# Patient Record
Sex: Male | Born: 1985 | Race: White | Hispanic: Yes | Marital: Single | Smoking: Never smoker
Health system: Southern US, Community
[De-identification: ages and names within clinical notes are randomized; demographics above are authoritative.]

---

## 2006-06-25 ENCOUNTER — Encounter (INDEPENDENT_AMBULATORY_CARE_PROVIDER_SITE_OTHER): Payer: Self-pay | Admitting: *Deleted

## 2006-06-25 ENCOUNTER — Emergency Department (HOSPITAL_COMMUNITY): Admission: EM | Admit: 2006-06-25 | Discharge: 2006-06-25 | Payer: Self-pay | Admitting: Emergency Medicine

## 2008-03-12 ENCOUNTER — Emergency Department (HOSPITAL_COMMUNITY): Admission: EM | Admit: 2008-03-12 | Discharge: 2008-03-12 | Payer: Self-pay | Admitting: Emergency Medicine

## 2008-03-28 ENCOUNTER — Emergency Department (HOSPITAL_COMMUNITY): Admission: EM | Admit: 2008-03-28 | Discharge: 2008-03-28 | Payer: Self-pay | Admitting: Emergency Medicine

## 2011-03-31 NOTE — Consult Note (Signed)
NAME:  FERMAN, BASILIO      ACCOUNT NO.:  [id number]   MEDICAL RECORD NO.:  [id number]          PATIENT TYPE:  EMS   LOCATION:  [facility]                         FACILITY:  [facility]   PHYSICIAN:  Artist Pais. Weingold, M.D.DATE OF BIRTH:  1986-08-20   DATE OF CONSULTATION:  DATE OF DISCHARGE:                                   CONSULTATION   REASON FOR CONSULTATION:  Mr. Cina is a 25 year old right-hand  dominant male, presents today with traumatic paint gun injury to his  nondominant left long finger with a tip amputation with contamination of the  distal part.  He is 25 years old, right hand dominant.  No known drug  allergies.  No current medications.  No recent hospitalizations or  surgeries.   FAMILY HISTORY:  Not contributory.   SOCIAL HISTORY:  Noncontributory.   PHYSICAL EXAMINATION:  GENERAL:  Fairly well-nourished male, pleasant, alert  and oriented x3.  Examination of his left hand.  He has a transverse amputation at the level  of the germinal matrix.  He has a slight bit of exposed distal phalangeal  bone, the proximal part.  The finger and hand show no evidence of the pain  going up into the flexor sheath.  No pain over the flexor sheath is noted.  The Kanavel sign is negative.  No __________ adenopathy.  The distal tip,  however, is completely infiltrated with oil-based paint.   IMPRESSION:  A 25 year old male with a tip amputation using a paint gun of  the nondominant left long finger.  Patient was given a 2% plain lidocaine  digital sheath block, was prepped and draped in usual sterile fashion.  Once  this was done bilateral Kutler V-Y flaps are raised on the radial and ulnar  sides and drawn over the top to cover the distal phalangeal bone.  He was  discharged with Keflex and Percocet for pain.  Call my office in 24-48  hours, sooner if there are any signs of infection.      Artist Pais Mina Marble, M.D.  Electronically Signed     MAW/MEDQ  D:   06/25/2006  T:  06/26/2006  Job:  [id number]

## 2015-04-10 ENCOUNTER — Emergency Department (HOSPITAL_BASED_OUTPATIENT_CLINIC_OR_DEPARTMENT_OTHER)
Admission: EM | Admit: 2015-04-10 | Discharge: 2015-04-10 | Disposition: A | Discharge status: Home / Self Care | Payer: 59 | Attending: Emergency Medicine | Admitting: Emergency Medicine

## 2015-04-10 ENCOUNTER — Encounter (HOSPITAL_BASED_OUTPATIENT_CLINIC_OR_DEPARTMENT_OTHER): Payer: Self-pay

## 2015-04-10 DIAGNOSIS — M545 Low back pain: Secondary | ICD-10-CM | POA: Diagnosis present

## 2015-04-10 DIAGNOSIS — M5430 Sciatica, unspecified side: Secondary | ICD-10-CM | POA: Diagnosis not present

## 2015-04-10 MED ORDER — HYDROMORPHONE HCL 1 MG/ML IJ SOLN
2.0000 mg | Freq: Once | INTRAMUSCULAR | Status: AC
  Administered 2015-04-10: 2 mg via INTRAMUSCULAR
  Filled 2015-04-10: qty 2

## 2015-04-10 MED ORDER — ONDANSETRON 8 MG PO TBDP
8.0000 mg | ORAL_TABLET | Freq: Once | ORAL | Status: AC
  Administered 2015-04-10: 8 mg via ORAL
  Filled 2015-04-10: qty 1

## 2015-04-10 MED ORDER — DIAZEPAM 5 MG PO TABS
5.0000 mg | ORAL_TABLET | Freq: Once | ORAL | Status: AC
  Administered 2015-04-10: 5 mg via ORAL
  Filled 2015-04-10: qty 1

## 2015-04-10 MED ORDER — DIAZEPAM 5 MG PO TABS: 5.0000 mg | ORAL_TABLET | Freq: Four times a day (QID) | ORAL | Status: AC | PRN

## 2015-04-10 MED ORDER — DEXAMETHASONE SODIUM PHOSPHATE 10 MG/ML IJ SOLN
10.0000 mg | Freq: Once | INTRAMUSCULAR | Status: AC
  Administered 2015-04-10: 10 mg via INTRAMUSCULAR
  Filled 2015-04-10: qty 1

## 2015-04-10 MED ORDER — DIAZEPAM 5 MG/ML IJ SOLN
5.0000 mg | Freq: Once | INTRAMUSCULAR | Status: DC
  Filled 2015-04-10: qty 2

## 2015-04-10 MED ORDER — IBUPROFEN 800 MG PO TABS: 800.0000 mg | ORAL_TABLET | Freq: Three times a day (TID) | ORAL | Status: AC

## 2015-04-10 MED ORDER — OXYCODONE-ACETAMINOPHEN 5-325 MG PO TABS: 1.0000 | ORAL_TABLET | ORAL | Status: DC | PRN

## 2015-04-10 NOTE — ED Provider Notes (Signed)
CSN: [id number]     Arrival date & time 04/10/15  1809 History  This chart was scribed for Gilda Creasehristopher J Jermaine Barot, MD by Evon Slackerrance Branch, ED Scribe. This patient was seen in room MH09/MH09 and the patient's care was started at 6:20 PM.    Chief Complaint  Patient presents with  . Back Pain   Patient is a 29 y.o. male presenting with back pain. The history is provided by the patient. No language interpreter was used.  Back Pain Associated symptoms: numbness    HPI Comments: Jermaine Duke is a 29 y.o. male who presents to the Emergency Department complaining of right sided low back pain onset 1 day prior. Pt states that the pain is radiating down his right leg down to his toes. Pt reports some numbness in the right leg as well.  Pt denies injury or fall. Pt reports Hx of sciatica.   History reviewed. No pertinent past medical history. History reviewed. No pertinent past surgical history. No family history on file. History  Substance Use Topics  . Smoking status: Never Smoker   . Smokeless tobacco: Not on file  . Alcohol Use: Not on file    Review of Systems  Musculoskeletal: Positive for back pain.  Neurological: Positive for numbness.  All other systems reviewed and are negative.    Allergies  Review of patient's allergies indicates no known allergies.  Home Medications   Prior to Admission medications   Medication Sig Start Date End Date Taking? Authorizing Provider  diazepam (VALIUM) 5 MG tablet Take 1 tablet (5 mg total) by mouth every 6 (six) hours as needed for anxiety (spasms). 04/10/15   Gilda Creasehristopher J Tinslee Klare, MD  ibuprofen (ADVIL,MOTRIN) 800 MG tablet Take 1 tablet (800 mg total) by mouth 3 (three) times daily. 04/10/15   Gilda Creasehristopher J Jack Bolio, MD  oxyCODONE-acetaminophen (PERCOCET) 5-325 MG per tablet Take 1-2 tablets by mouth every 4 (four) hours as needed. 04/10/15   Gilda Creasehristopher J Minaal Struckman, MD   BP 126/74 mmHg  Pulse 56  Temp(Src) 97.7 F (36.5 C) (Oral)   Resp 16  SpO2 99%   Physical Exam  Constitutional: He is oriented to person, place, and time. He appears well-developed and well-nourished. No distress.  HENT:  Head: Normocephalic and atraumatic.  Right Ear: Hearing normal.  Left Ear: Hearing normal.  Nose: Nose normal.  Mouth/Throat: Oropharynx is clear and moist and mucous membranes are normal.  Eyes: Conjunctivae and EOM are normal. Pupils are equal, round, and reactive to light.  Neck: Normal range of motion. Neck supple.  Cardiovascular: Regular rhythm, S1 normal and S2 normal.  Exam reveals no gallop and no friction rub.   No murmur heard. Pulmonary/Chest: Effort normal and breath sounds normal. No respiratory distress. He exhibits no tenderness.  Abdominal: Soft. Normal appearance and bowel sounds are normal. There is no hepatosplenomegaly. There is no tenderness. There is no rebound, no guarding, no tenderness at McBurney's point and negative Murphy's sign. No hernia.  Musculoskeletal: Normal range of motion. He exhibits tenderness.  Pain tenderness and  Spasm of right lower back, pain with ROM of right leg, normal strength and sensation.   Neurological: He is alert and oriented to person, place, and time. He has normal strength. No cranial nerve deficit or sensory deficit. Coordination normal. GCS eye subscore is 4. GCS verbal subscore is 5. GCS motor subscore is 6.  Skin: Skin is warm, dry and intact. No rash noted. No cyanosis.  Psychiatric: He has a normal mood and  affect. His speech is normal and behavior is normal. Thought content normal.  Nursing note and vitals reviewed.   ED Course  Procedures (including critical care time) DIAGNOSTIC STUDIES: Oxygen Saturation is 98% on RA, normal by my interpretation.    COORDINATION OF CARE: 6:27 PM-Discussed treatment plan with pt at bedside and pt agreed to plan.     Labs Review Labs Reviewed - No data to display  Imaging Review No results found.   EKG  Interpretation None      MDM   Final diagnoses:  Sciatica, unspecified laterality   She presents to the ER for evaluation of back pain radiating down legs, predominantly on the right side. Patient reports that he has been having numbness and tingling in the right lower leg. Patient denies injury. He does have a history of sciatica in the past. Patient has normal strength and sensation in lower extremity's. No saddle anesthesia. There is no nerve deficit.  And Zofran. After the child a lot of he started to feel nauseated and ill. He did not want any further medications, but ultimately he was convinced to have a Decadron shot and take oral Valium because he is having spasms when he tries to sit up. When lying flat he appears comfortable. Patient was counseled that he will need to have continued treatment for sciatica at home, will need follow-up if not improving for MRI. Examination today does not show any signs that would require urgent or emergent MRI.   I personally performed the services described in this documentation, which was scribed in my presence. The recorded information has been reviewed and is accurate.       Gilda Crease, MD 04/10/15 [phone]

## 2015-04-10 NOTE — ED Notes (Signed)
Patient here with back pain radiating down right leg, states that his toes feel slightly numb, denies injury

## 2015-04-10 NOTE — Discharge Instructions (Signed)
Citica  (Sciatica)  La citica es el dolor, debilidad, entumecimiento u hormigueo a lo largo del nervio citico. El nervio comienza en la zona inferior de la espalda y desciende por la parte posterior de cada pierna. El nervio controla los msculos de la parte inferior de la pierna y de la zona posterior de la rodilla, y transmite la sensibilidad a la parte posterior del muslo, la pierna y la planta del pie. La citica es un sntoma de otras afecciones mdicas. Por ejemplo, un dao a los nervios o algunas enfermedades como un disco herniado o un espoln seo en la columna vertebral, podran daarle o presionar en el nervio citico. Esto causa dolor, debilidad y otras sensaciones normalmente asociadas con la citica. Generalmente la citica afecta slo un lado del cuerpo. CAUSAS   Disco herniado o desplazado.  Enfermedad degenerativa del disco.  Un sndrome doloroso que compromete un msculo angosto de los glteos (sndrome piriforme).  Lesin o fractura plvica.  Embarazo.  Tumor (casos raros). SNTOMAS  Los sntomas pueden variar de leves a muy graves. Por lo general, los sntomas descienden desde la zona lumbar a las nalgas y la parte posterior de la pierna. Ellos son:   Hormigueo leve o dolor sordo en la parte inferior de la espalda, la pierna o la cadera.  Adormecimiento en la parte posterior de la pantorrilla o la planta del pie.  Sensacin de quemazn en la zona lumbar, la pierna o la cadera.  Dolor agudo en la zona inferior de la espalda, la pierna o la cadera.  Debilidad en las piernas.  Dolor de espalda intenso que inhibe los movimientos. Los sntomas pueden empeorar al toser, estornudar, rer o estar sentado o parado durante mucho tiempo. Adems, el sobrepeso puede empeorar los sntomas.  DIAGNSTICO  Su mdico le har un examen fsico para buscar los sntomas comunes de la citica. Le pedir que haga algunos movimientos o actividades que activaran el dolor del nervio  citico. Para encontrar las causas de la citica podr indicarle otros estudios. Estos pueden ser:   Anlisis de sangre.  Radiografas.  Pruebas de diagnstico por imgenes, como resonancia magntica o tomografa computada. TRATAMIENTO  El tratamiento se dirige a las causas de la citica. A veces, el tratamiento no es necesario, y el dolor y el malestar desaparecen por s mismos. Si necesita tratamiento, su mdico puede sugerir:   Medicamentos de venta libre para aliviar el dolor.  Medicamentos recetados, como antiinflamatorios, relajantes musculares o narcticos.  Aplicacin de calor o hielo en la zona del dolor.  Inyecciones de corticoides para disminuir el dolor, la irritacin y la inflamacin alrededor del nervio.  Reduccin de la actividad en los perodos de dolor.  Ejercicios y estiramiento del abdomen para fortalecer y mejorar la flexibilidad de la columna vertebral. Su mdico puede sugerirle perder peso si el peso extra empeora el dolor de espalda.  Fisioterapia.  La ciruga para eliminar lo que presiona o pincha el nervio, como un espoln seo o parte de una hernia de disco. INSTRUCCIONES PARA EL CUIDADO EN EL HOGAR   Slo tome medicamentos de venta libre o recetados para calmar el dolor o el malestar, segn las indicaciones de su mdico.  Aplique hielo sobre el rea dolorida durante 20 minutos 3-4 veces por da durante los primeras 48-72 horas. Luego intente aplicar calor de la misma manera.  Haga ejercicios, elongue o realice sus actividades habituales, si no le causan ms dolor.  Cumpla con todas las sesiones de fisioterapia, segn le   indique su mdico.  Cumpla con todas las visitas de control, segn le indique su mdico.  No use tacones altos o zapatos que no tengan buen apoyo.  Verifique que el colchn no sea muy blando. Un colchn firme aliviar el dolor y las molestias. SOLICITE ATENCIN MDICA DE INMEDIATO SI:   Pierde el control de la vejiga o del intestino  (incontinencia).  Aumenta la debilidad en la zona inferior de la espalda, la pelvis, las nalgas o las piernas.  Siente irritacin o inflamacin en la espalda.  Tiene sensacin de ardor al orinar.  El dolor empeora cuando se acuesta o lo despierta por la noche.  El dolor es peor del que experiment en el pasado.  Dura ms de 4 semanas.  Pierde peso sin motivo de manera sbita. ASEGRESE DE QUE:   Comprende estas instrucciones.  Controlar su enfermedad.  Solicitar ayuda de inmediato si no mejora o si empeora. Document Released: 10/30/2005 Document Revised: 04/30/2012 Exit[employer] Patient Information 2015 [employer]. This information is not intended to replace advice given to you by your health care provider. Make sure you discuss any questions you have with your health care provider.  

## 2019-06-17 ENCOUNTER — Other Ambulatory Visit: Payer: Self-pay

## 2019-06-17 ENCOUNTER — Emergency Department (HOSPITAL_COMMUNITY)
Admission: EM | Admit: 2019-06-17 | Discharge: 2019-06-18 | Disposition: A | Discharge status: Home / Self Care | Payer: Self-pay | Attending: Emergency Medicine | Admitting: Emergency Medicine

## 2019-06-17 ENCOUNTER — Emergency Department (HOSPITAL_COMMUNITY): Payer: Self-pay

## 2019-06-17 ENCOUNTER — Encounter (HOSPITAL_COMMUNITY): Payer: Self-pay

## 2019-06-17 DIAGNOSIS — R600 Localized edema: Secondary | ICD-10-CM | POA: Insufficient documentation

## 2019-06-17 DIAGNOSIS — M79641 Pain in right hand: Secondary | ICD-10-CM | POA: Insufficient documentation

## 2019-06-17 DIAGNOSIS — I10 Essential (primary) hypertension: Secondary | ICD-10-CM | POA: Insufficient documentation

## 2019-06-17 LAB — CBC WITH DIFFERENTIAL/PLATELET
Abs Immature Granulocytes: 0.1 10*3/uL — ABNORMAL HIGH (ref 0.00–0.07)
Basophils Absolute: 0.1 10*3/uL (ref 0.0–0.1)
Basophils Relative: 1 %
Eosinophils Absolute: 0.2 10*3/uL (ref 0.0–0.5)
Eosinophils Relative: 2 %
HCT: 47.3 % (ref 39.0–52.0)
Hemoglobin: 16.3 g/dL (ref 13.0–17.0)
Immature Granulocytes: 1 %
Lymphocytes Relative: 32 %
Lymphs Abs: 2.8 10*3/uL (ref 0.7–4.0)
MCH: 29.8 pg (ref 26.0–34.0)
MCHC: 34.5 g/dL (ref 30.0–36.0)
MCV: 86.5 fL (ref 80.0–100.0)
Monocytes Absolute: 0.8 10*3/uL (ref 0.1–1.0)
Monocytes Relative: 9 %
Neutro Abs: 4.9 10*3/uL (ref 1.7–7.7)
Neutrophils Relative %: 55 %
Platelets: 191 10*3/uL (ref 150–400)
RBC: 5.47 MIL/uL (ref 4.22–5.81)
RDW: 12.5 % (ref 11.5–15.5)
WBC: 8.8 10*3/uL (ref 4.0–10.5)
nRBC: 0 % (ref 0.0–0.2)

## 2019-06-17 LAB — BASIC METABOLIC PANEL
Anion gap: 10 (ref 5–15)
BUN: 12 mg/dL (ref 6–20)
CO2: 24 mmol/L (ref 22–32)
Calcium: 9.4 mg/dL (ref 8.9–10.3)
Chloride: 103 mmol/L (ref 98–111)
Creatinine, Ser: 0.8 mg/dL (ref 0.61–1.24)
GFR calc Af Amer: >60 mL/min (ref >60)
GFR calc non Af Amer: >60 mL/min (ref >60)
Glucose, Bld: 108 mg/dL — ABNORMAL HIGH (ref 70–99)
Potassium: 3.7 mmol/L (ref 3.5–5.1)
Sodium: 137 mmol/L (ref 135–145)

## 2019-06-17 LAB — URIC ACID: Uric Acid, Serum: 7.2 mg/dL (ref 3.7–8.6)

## 2019-06-17 NOTE — ED Provider Notes (Signed)
33 year old male received at sign out pending labs and X-ray. Per her HPI:   "Jermaine Duke is a 33 y.o. male with no significant past medical history who presents today for evaluation of right hand pain.  He reports that since yesterday he has had swelling in his right hand with increasing pain the more his swelling increases.  He denies any known injury.  He has never had anything similar before.  He states that his mother told him that it may be a joint problem as he drinks at least 4 drinks a day.  He reports that the area is painful.  He denies any known history of gout.  No fevers.  He denies any known tick bites."  Physical Exam  BP (!) 156/89 (BP Location: Left Arm)   Pulse 76   Temp 97.9 F (36.6 C) (Oral)   Resp 18   SpO2 98%   Physical Exam Constitutional:      Appearance: He is well-developed.  HENT:     Head: Normocephalic and atraumatic.  Neck:     Musculoskeletal: Neck supple.  Pulmonary:     Effort: Pulmonary effort is normal.  Musculoskeletal:     Comments: Tender to palpation over the right second MCP.  No redness.  Neurological:     Mental Status: He is alert.     Cranial Nerves: No cranial nerve deficit.  Psychiatric:        Behavior: Behavior normal.     ED Course/Procedures     Procedures  MDM  33 year old male received a signout from [facility] pending labs and imaging.  X-ray of the right hand with mild soft tissue swelling, but no acute osseous abnormality.  There is removed histrionic deformity of the midshaft of the second metacarpal, and mineralization is normal.  CRP is elevated at 1.5, but sed rate is normal.  He has no leukocytosis to suggest infectious etiology.  Patient does report that he has been drinking significantly more beer over the last 4 to 5 days.  Low suspicion for septic joint or cardiogenic tenosynovitis.  Will initiate treatment for gout with prednisone and discharge the patient with a short course of pain medicine. A  58-month prescription history query was performed using the [place] CSRS prior to discharge.  Patient was elevated today and he was advised to follow-up with primary care for blood pressure recheck his symptoms.  He is hemodynamically stable and in no acute distress.  Safe for discharge home with outpatient follow-up this time.        Joanne Gavel, PA-C 06/18/19 8527    Merryl Hacker, MD 06/22/19 [phone]

## 2019-06-17 NOTE — ED Triage Notes (Signed)
Pt states he has been having right hand pain since yesterday. He reports limited ROM. He also reports some swelling. Would like to have an xray denies any injury.

## 2019-06-17 NOTE — ED Provider Notes (Signed)
[facility] EMERGENCY DEPARTMENT Provider Note   CSN: [id number] Arrival date & time: 06/17/19  2048    History   Chief Complaint Chief Complaint  Patient presents with  . Hand Pain    HPI Jermaine Duke is a 33 y.o. male with no significant past medical history who presents today for evaluation of right hand pain.  He reports that since yesterday he has had swelling in his right hand with increasing pain the more his swelling increases.  He denies any known injury.  He has never had anything similar before.  He states that his mother told him that it may be a joint problem as he drinks at least 4 drinks a day.  He reports that the area is painful.  He denies any known history of gout.  No fevers.  He denies any known tick bites.     HPI  History reviewed. No pertinent past medical history.  There are no active problems to display for this patient.   History reviewed. No pertinent surgical history.      Home Medications    Prior to Admission medications   Medication Sig Start Date End Date Taking? Authorizing Provider  diazepam (VALIUM) 5 MG tablet Take 1 tablet (5 mg total) by mouth every 6 (six) hours as needed for anxiety (spasms). 04/10/15   Orpah Greek, MD  ibuprofen (ADVIL,MOTRIN) 800 MG tablet Take 1 tablet (800 mg total) by mouth 3 (three) times daily. 04/10/15   Orpah Greek, MD  oxyCODONE-acetaminophen (PERCOCET) 5-325 MG per tablet Take 1-2 tablets by mouth every 4 (four) hours as needed. 04/10/15   Orpah Greek, MD    Family History History reviewed. No pertinent family history.  Social History Social History   Tobacco Use  . Smoking status: Never Smoker  . Smokeless tobacco: Never Used  Substance Use Topics  . Alcohol use: Not on file  . Drug use: Not on file     Allergies   Patient has no known allergies.   Review of Systems Review of Systems  Constitutional: Negative for chills and fever.   Musculoskeletal: Positive for joint swelling. Negative for neck pain and neck stiffness.  Skin: Negative for color change and wound.  All other systems reviewed and are negative.    Physical Exam Updated Vital Signs BP (!) 156/89 (BP Location: Left Arm)   Pulse 76   Temp 97.9 F (36.6 C) (Oral)   Resp 18   SpO2 98%   Physical Exam Vitals signs and nursing note reviewed.  Constitutional:      General: He is not in acute distress.    Appearance: He is well-developed. He is not diaphoretic.  HENT:     Head: Normocephalic and atraumatic.  Eyes:     General: No scleral icterus.       Right eye: No discharge.        Left eye: No discharge.     Conjunctiva/sclera: Conjunctivae normal.  Neck:     Musculoskeletal: Normal range of motion.  Cardiovascular:     Rate and Rhythm: Normal rate and regular rhythm.  Pulmonary:     Effort: Pulmonary effort is normal. No respiratory distress.     Breath sounds: No stridor.  Abdominal:     General: There is no distension.  Musculoskeletal:     Comments: Please see clinical image.  There is obvious swelling of the right hand.  There is tenderness to palpation primarily over the right index  finger on the palmar aspect near the MCP joint and along the second metacarpal.  There is diffuse edema primarily to the thumb, index finger and long finger.  He has difficulty bending his right index finger secondary to pain and swelling.  No crepitus or deformities palpated.  There is mild tenderness to palpation up the right forearm without frank edema.  Skin:    General: Skin is warm and dry.     Comments: Right hand is not abnormally red, there are no obvious wounds on the right hand or forearm.  Neurological:     Mental Status: He is alert.     Sensory: No sensory deficit (Sensation intact to light touch).     Motor: No abnormal muscle tone.  Psychiatric:        Mood and Affect: Mood normal.        Behavior: Behavior normal.               ED Treatments / Results  Labs (all labs ordered are listed, but only abnormal results are displayed) Labs Reviewed  CBC WITH DIFFERENTIAL/PLATELET - Abnormal; Notable for the following components:      Result Value   Abs Immature Granulocytes 0.10 (*)    All other components within normal limits  BASIC METABOLIC PANEL - Abnormal; Notable for the following components:   Glucose, Bld 108 (*)    All other components within normal limits  URIC ACID  SEDIMENTATION RATE  C-REACTIVE PROTEIN    EKG None  Radiology Dg Hand Complete Right  Result Date: 06/17/2019 CLINICAL DATA:  Right hand pain and swelling localized to the second metacarpal for 1 day. No known injury. Prior right hand fracture at the level of the second metacarpal. EXAM: RIGHT HAND - COMPLETE 3+ VIEW COMPARISON:  None. FINDINGS: Mild soft tissue swelling is noted diffusely. There is remote posttraumatic deformity of the mid shaft of the second metacarpal. No acute fracture or traumatic malalignment is evident. Bone mineralization is normal. No significant arthrosis or other suspicious osseous lesions. Mild negative ulnar variance. IMPRESSION: Mild soft tissue swelling. No acute osseous abnormality. Electronically Signed   By: Lovena Le M.D.   On: 06/17/2019 22:00    Procedures Procedures (including critical care time)  Medications Ordered in ED Medications - No data to display   Initial Impression / Assessment and Plan / ED Course  I have reviewed the triage vital signs and the nursing notes.  Pertinent labs & imaging results that were available during my care of the patient were reviewed by me and considered in my medical decision making (see chart for details).       Patient presents today for evaluation of right hand pain and swelling since yesterday.  On exam he has significant edema over the right hand primarily on the radial aspect with tenderness to palpation along the second MCP on the palmar aspect.   There are no obvious wounds.  He does not have a known history of gout, and the area is not red or hot however is swollen and painful.  Will obtain labs including CBC, BMP, uric acid, ESR, CRP.  X-ray did not show evidence of fracture or other abnormality.  At shift change care was transferred to [facility] who will follow pending studies, re-evaulate and determine disposition.      Final Clinical Impressions(s) / ED Diagnoses   Final diagnoses:  Right hand pain  Hypertension, unspecified type    ED Discharge Orders  None       Ollen Gross 06/18/19 Lynnell Catalan    Dorie Rank, MD 06/18/19 1040

## 2019-06-18 LAB — C-REACTIVE PROTEIN: CRP: 1.5 mg/dL — ABNORMAL HIGH (ref <1.0)

## 2019-06-18 LAB — SEDIMENTATION RATE: Sed Rate: 3 mm/hr (ref 0–16)

## 2019-06-18 MED ORDER — OXYCODONE-ACETAMINOPHEN 5-325 MG PO TABS: 1.0000 | ORAL_TABLET | Freq: Three times a day (TID) | ORAL | 0 refills | Status: AC | PRN

## 2019-06-18 MED ORDER — PREDNISONE 10 MG PO TABS: ORAL_TABLET | ORAL | 0 refills | Status: AC

## 2019-06-18 MED ORDER — PREDNISONE 20 MG PO TABS: 40.0000 mg | ORAL_TABLET | Freq: Every day | ORAL | 0 refills | Status: DC

## 2019-06-18 NOTE — Discharge Instructions (Addendum)
°  Su trabajo de hoy es preocupante para la gota.  Mientras estaba en el servicio de urgencias, su presin arterial era alta. Haga un seguimiento con su mdico de atencin primaria o la clnica de bienestar para una evaluacin repetida, ya que puede necesitar medicamentos. La presin arterial alta puede causar daos a largo plazo, potencialmente graves, si no se trata.  [place]. Asegrese de Artist curso completo de 10 das.  Puede tomar 600 mg de ibuprofeno con alimentos o 650 mg de Tylenol una vez cada 6 horas para Conservation officer, historic buildings. Asegrese de tomar ibuprofeno con alimentos para Materials engineer. Puede alternar entre estos 2 medicamentos cada 3 horas si el dolor es intenso. Para el dolor intenso e incontrolable, puede tomar 1 tableta de Percocet. Percocet es un narctico y puede ser [place]. No trabaje, conduzca ni beba alcohol mientras est tomando este medicamento porque lo debilita. Cada comprimido de Percocet contiene 325 mg de Tylenol. Asegrese de no tomar 4000 mg de Tylenol de todas las fuentes en un perodo de 24 horas.  Regrese al departamento de emergencias si presenta fiebre, escalofros, si comienza a tener un drenaje espeso y mucoso de la herida, si la herida se pone muy roja, caliente al tacto y comienza a desarrollar rayas rojas en el brazo.  Your work-up today is concerning for gout.  While in the ED your blood pressure was high.  Please follow up with your primary care doctor or the wellness clinic for repeat evaluation as you may need medication.  High blood pressure can cause long term, potentially serious, damage if left untreated.   Take prednisone as directed.  Make sure to complete the entire 10-day course.  You can take 600 mg of ibuprofen with food or 650 mg of Tylenol once every 6 hours for pain.  Make sure you take ibuprofen with food to avoid upsetting your stomach.  You can alternate between these 2 medications every 3 hours if  pain is severe.  For severe, uncontrollable pain, you can take 1 tablet of Percocet.  Percocet is a narcotic and can be addicting.  Do not work, drive, or drink alcohol while taking this medication because it makes you impaired.  Each tablet of Percocet contains 325 mg of Tylenol.  Make sure that you do not take 4000 mg of Tylenol from all sources in a 24-hour period.  Return to the emergency department if you develop fever, chills, if you start having thick, mucus-like drainage from the wound, if the wound gets very red, hot to the touch, and you start to develop red streaking up the arm.

## 2019-08-19 ENCOUNTER — Other Ambulatory Visit: Payer: Self-pay

## 2019-08-19 DIAGNOSIS — Z20822 Contact with and (suspected) exposure to covid-19: Secondary | ICD-10-CM

## 2019-08-21 LAB — NOVEL CORONAVIRUS, NAA: SARS-CoV-2, NAA: NOT DETECTED

## 2020-07-06 ENCOUNTER — Other Ambulatory Visit: Payer: Self-pay | Admitting: Critical Care Medicine

## 2020-07-06 ENCOUNTER — Other Ambulatory Visit: Payer: Self-pay

## 2020-07-06 DIAGNOSIS — Z20822 Contact with and (suspected) exposure to covid-19: Secondary | ICD-10-CM

## 2020-07-07 LAB — NOVEL CORONAVIRUS, NAA: SARS-CoV-2, NAA: NOT DETECTED

## 2020-07-07 LAB — SARS-COV-2, NAA 2 DAY TAT

## 2021-03-17 ENCOUNTER — Emergency Department (HOSPITAL_COMMUNITY): Payer: 59

## 2021-03-17 ENCOUNTER — Emergency Department (HOSPITAL_COMMUNITY)
Admission: EM | Admit: 2021-03-17 | Discharge: 2021-03-17 | Disposition: A | Discharge status: Home / Self Care | Payer: 59 | Attending: Emergency Medicine | Admitting: Emergency Medicine

## 2021-03-17 ENCOUNTER — Other Ambulatory Visit: Payer: Self-pay

## 2021-03-17 DIAGNOSIS — M79672 Pain in left foot: Secondary | ICD-10-CM | POA: Insufficient documentation

## 2021-03-17 LAB — CBG MONITORING, ED: Glucose-Capillary: 127 mg/dL — ABNORMAL HIGH (ref 70–99)

## 2021-03-17 NOTE — ED Triage Notes (Signed)
Pt reports bilateral foot pain and itching and bilateral leg pain without injury x 1 week.

## 2021-03-17 NOTE — ED Provider Notes (Signed)
Emergency Medicine Provider Triage Evaluation Note  Jermaine Duke , a 35 y.o. male  was evaluated in triage.  Pt complains of foot pain  Review of Systems  Positive: Bumps on feet Negative: No fever  Physical Exam  BP (!) 131/91 (BP Location: Right Arm)   Pulse 76   Temp 98.3 F (36.8 C)   Resp 18   SpO2 99%  Gen:   Awake, no distress   Resp:  Normal effort  MSK:   Moves extremities without difficulty  Other:  Slight redness bottom of right foot  Medical Decision Making  Medically screening exam initiated at 1:16 PM.  Appropriate orders placed.  Jermaine Duke was informed that the remainder of the evaluation will be completed by another provider, this initial triage assessment does not replace that evaluation, and the importance of remaining in the ED until their evaluation is complete.     Osie Cheeks 03/17/21 1317    Benjiman Core, MD 03/17/21 1650

## 2021-03-17 NOTE — Discharge Instructions (Signed)
Recommend anti-inflammatories for pain such as ibuprofen or Aleve.  Make sure to ice the bottom of your foot.  I recommend gel inserts.  Return for new or worsening symptoms

## 2021-03-17 NOTE — ED Notes (Signed)
Patient reports that he is having "spots," of pain on bilateral feet. States that he is up on his feet all day, just recently noticed these darker spots on his feet that only hurt while he is standing or walking. Spots are darkened and appear to look like a blister that has already popped.

## 2021-03-17 NOTE — ED Provider Notes (Signed)
[facility] EMERGENCY DEPARTMENT Provider Note   CSN: [id number] Arrival date & time: 03/17/21  1234     History Chief Complaint  Patient presents with  . Leg Pain  . Foot Pain    Jermaine Duke is a 35 y.o. male with past medical history who presents for evaluation of left foot pain.  Pain began yesterday.  Pain located to his plantar aspect of calcaneus.  He denies any recent injury or trauma.  Pain worse with stepping on his heel.  Denies any redness, swelling or warmth.  Did state he recently got new shoes for work which have hard insoles.  Had similar pain to his right foot last week which improved after changing his shoes.  No history of diabetes.  No rashes or lesions.  No pain to bilateral calves or swelling. No history of PE or DVT.  He has not taken anything for symptoms.  Denies fever, chills, nausea vomiting, chest pain, shortness breath abdominal pain, paresthesias or weakness.  Denies additional aggravating relieving factors.  Rates pain a 4/10.  History obtained from patient and past medical records.  No interpreter used  HPI     No past medical history on file.  There are no problems to display for this patient.   No past surgical history on file.     No family history on file.  Social History   Tobacco Use  . Smoking status: Never Smoker  . Smokeless tobacco: Never Used    Home Medications Prior to Admission medications   Medication Sig Start Date End Date Taking? Authorizing Provider  diazepam (VALIUM) 5 MG tablet Take 1 tablet (5 mg total) by mouth every 6 (six) hours as needed for anxiety (spasms). 04/10/15   Gilda Crease, MD  ibuprofen (ADVIL,MOTRIN) 800 MG tablet Take 1 tablet (800 mg total) by mouth 3 (three) times daily. 04/10/15   Gilda Crease, MD  oxyCODONE-acetaminophen (PERCOCET/ROXICET) 5-325 MG tablet Take 1 tablet by mouth every 8 (eight) hours as needed for severe pain. 06/18/19   McDonald, Mia A,  PA-C    Allergies    Patient has no known allergies.  Review of Systems   Review of Systems  Constitutional: Negative.   HENT: Negative.   Respiratory: Negative.   Cardiovascular: Negative.   Gastrointestinal: Negative.   Genitourinary: Negative.   Musculoskeletal: Negative for gait problem, neck pain and neck stiffness.       Left plantar foot pain  Skin: Negative.   Neurological: Negative.   All other systems reviewed and are negative.   Physical Exam Updated Vital Signs BP (!) 138/113 (BP Location: Right Wrist)   Pulse 62   Temp 98.3 F (36.8 C) (Oral)   Resp 17   SpO2 97%   Physical Exam Vitals and nursing note reviewed.  Constitutional:      General: He is not in acute distress.    Appearance: He is well-developed. He is not ill-appearing, toxic-appearing or diaphoretic.  HENT:     Head: Normocephalic and atraumatic.     Nose: Nose normal.     Mouth/Throat:     Mouth: Mucous membranes are moist.  Eyes:     Pupils: Pupils are equal, round, and reactive to light.  Cardiovascular:     Rate and Rhythm: Normal rate and regular rhythm.     Pulses: Normal pulses.     Heart sounds: Normal heart sounds.  Pulmonary:     Effort: Pulmonary effort is normal. No respiratory  distress.     Breath sounds: Normal breath sounds.  Abdominal:     General: Bowel sounds are normal. There is no distension.     Palpations: Abdomen is soft.  Musculoskeletal:        General: Normal range of motion.     Cervical back: Normal range of motion and neck supple.     Comments: Tenderness to calcaneus and plantar fascia to left foot.  No overlying erythema or warmth.  No rashes or lesions.  No bony tenderness to navicular, ankle, tib-fib.  Wiggles toes without difficulty.  No overlying skin changes.  No bony tenderness or overlying skin changes to right lower extremity.  Skin:    General: Skin is warm and dry.     Capillary Refill: Capillary refill takes less than 2 seconds.      Comments: No edema, erythema or warmth.  No fluctuance or induration.  No desquamated skin, ulceration  Neurological:     General: No focal deficit present.     Mental Status: He is alert and oriented to person, place, and time.     Comments: Ambulatory without difficulty Intact sensation     ED Results / Procedures / Treatments   Labs (all labs ordered are listed, but only abnormal results are displayed) Labs Reviewed  CBG MONITORING, ED - Abnormal; Notable for the following components:      Result Value   Glucose-Capillary 127 (*)    All other components within normal limits    EKG None  Radiology DG Foot Complete Left  Result Date: 03/17/2021 CLINICAL DATA:  Calcaneal pain EXAM: LEFT FOOT - COMPLETE 3+ VIEW COMPARISON:  None. FINDINGS: No acute displaced fracture or malalignment. Minimal linear sclerosis within the lower calcaneus, location and orientation not considered typical for stress fracture. Tiny plantar calcaneal spur. IMPRESSION: 1. No definite acute osseous abnormality. 2. Very tiny plantar calcaneal spur 3. There is some linear sclerosis within the inferior calcaneus but the location and orientation is not felt typical for stress fracture Electronically Signed   By: Jasmine Pang M.D.   On: 03/17/2021 17:54    Procedures Procedures   Medications Ordered in ED Medications - No data to display  ED Course  I have reviewed the triage vital signs and the nursing notes.  Pertinent labs & imaging results that were available during my care of the patient were reviewed by me and considered in my medical decision making (see chart for details).  35 year old here for evaluation of left plantar foot pain.  He is afebrile, nonseptic, non-ill-appearing.  He is neurovascularly intact.  Initially had pain to his right plantar foot when starting to wear new shoes at work.  Switched his shoes his right pain resolved however now is located to his left calcaneus.  No recent injury or  trauma.  He has no overlying skin changes, no evidence of retained foreign body, edema, erythema or warmth.  No bony tenderness to tib-fib, ankle or navicular.  Pain primarily to calcaneus and plantar fascia.  He is ambulatory without difficulty however does admit to having pain when stepping on his calcaneus.  No clinical evidence of DVT on exam.  Plan on x-ray  X-ray with heel spur there was some question of possible stress fracture?  Discussed with patient RICE for symptomatic management, gel inserts for shoes, ice rolling foot.  He will follow-up with orthopedics if symptoms not resolved.  I have low suspicion for acute infectious process, occult fracture, tendon, ligament, vascular etiology  of symptoms  The patient has been appropriately medically screened and/or stabilized in the ED. I have low suspicion for any other emergent medical condition which would require further screening, evaluation or treatment in the ED or require inpatient management.  Patient is hemodynamically stable and in no acute distress.  Patient able to ambulate in department prior to ED.  Evaluation does not show acute pathology that would require ongoing or additional emergent interventions while in the emergency department or further inpatient treatment.  I have discussed the diagnosis with the patient and answered all questions.  Pain is been managed while in the emergency department and patient has no further complaints prior to discharge.  Patient is comfortable with plan discussed in room and is stable for discharge at this time.  I have discussed strict return precautions for returning to the emergency department.  Patient was encouraged to follow-up with PCP/specialist refer to at discharge.    MDM Rules/Calculators/A&P                          Final Clinical Impression(s) / ED Diagnoses Final diagnoses:  Foot pain, left    Rx / DC Orders ED Discharge Orders    None       Teauna Dubach A,  PA-C 03/17/21 1839    Arby Barrette, MD 03/29/21 1131

## 2021-05-06 ENCOUNTER — Other Ambulatory Visit: Payer: PRIVATE HEALTH INSURANCE

## 2022-05-31 ENCOUNTER — Emergency Department (HOSPITAL_COMMUNITY)
Admission: EM | Admit: 2022-05-31 | Discharge: 2022-05-31 | Disposition: A | Discharge status: Home / Self Care | Payer: PRIVATE HEALTH INSURANCE | Attending: Emergency Medicine | Admitting: Emergency Medicine

## 2022-05-31 ENCOUNTER — Encounter (HOSPITAL_COMMUNITY): Payer: Self-pay

## 2022-05-31 ENCOUNTER — Other Ambulatory Visit: Payer: Self-pay

## 2022-05-31 DIAGNOSIS — T7840XA Allergy, unspecified, initial encounter: Secondary | ICD-10-CM

## 2022-05-31 DIAGNOSIS — R21 Rash and other nonspecific skin eruption: Secondary | ICD-10-CM | POA: Insufficient documentation

## 2022-05-31 DIAGNOSIS — T781XXA Other adverse food reactions, not elsewhere classified, initial encounter: Secondary | ICD-10-CM | POA: Insufficient documentation

## 2022-05-31 MED ORDER — FAMOTIDINE 20 MG PO TABS
20.0000 mg | ORAL_TABLET | Freq: Once | ORAL | Status: AC
  Administered 2022-05-31: 20 mg via ORAL
  Filled 2022-05-31: qty 1

## 2022-05-31 MED ORDER — FAMOTIDINE 20 MG PO TABS: 20.0000 mg | ORAL_TABLET | Freq: Two times a day (BID) | ORAL | 0 refills | Status: AC

## 2022-05-31 MED ORDER — PREDNISONE 10 MG PO TABS: 20.0000 mg | ORAL_TABLET | Freq: Every day | ORAL | 0 refills | Status: AC

## 2022-05-31 MED ORDER — PREDNISONE 20 MG PO TABS
20.0000 mg | ORAL_TABLET | Freq: Once | ORAL | Status: AC
Start: 2022-05-31 — End: 2022-05-31
  Administered 2022-05-31: 20 mg via ORAL
  Filled 2022-05-31: qty 1

## 2022-05-31 MED ORDER — LORATADINE 10 MG PO TABS: 10.0000 mg | ORAL_TABLET | Freq: Every day | ORAL | 0 refills | Status: AC

## 2022-05-31 NOTE — ED Triage Notes (Signed)
BIBA for itching after drinking cherry Dr pepper that was expired @ 2030 with itching starting @2100 . Redness noted to body. No hives noted.   Denies SHOB, CP

## 2022-05-31 NOTE — Discharge Instructions (Signed)
Likely you had a allergic reaction, please discontinue any drinking, I have given you prescription for Pepcid as well as Claritin to help with the itchiness.  These can be found over-the-counter please asked the pharmacist which would be the cheaper option to buy.  Also given you a prescription for prednisone please take as prescribed  Please follow-up with PCP for further evaluation  Come back to the emergency department if you develop chest pain, shortness of breath, severe abdominal pain, uncontrolled nausea, vomiting, diarrhea.

## 2022-05-31 NOTE — ED Provider Notes (Signed)
[facility]-EMERGENCY DEPT Provider Note   CSN: [id number] Arrival date & time: 05/31/22  0113     History  Chief Complaint  Patient presents with   Allergic Reaction    Jermaine Duke is a 36 y.o. male.  HPI  HPI was collected while using Spanish interpreter  Without significant medical history presents with concern of a allergic reaction.  Patient states that after he drank a Dr. Reino Kent he started to feel itchiness on his upper chest, he notes that he developed a red rash all over his chest, states that he took some Benadryl without much relief.  He denies any tongue throat lip swelling difficulty breathing denies any GI symptoms, states he is never had this reaction to soda in the past.  He does state that this has happened once after he ate some seafood.  Patient denies recent medication changes, no new environmental changes.  Patient does endorse that after EMS gave him some Benadryl his symptoms had improved.  EMS provided patient with 75 mg of Benadryl.  Home Medications Prior to Admission medications   Medication Sig Start Date End Date Taking? Authorizing Provider  famotidine (PEPCID) 20 MG tablet Take 1 tablet (20 mg total) by mouth 2 (two) times daily for 5 days. 05/31/22 06/05/22 Yes Carroll Sage, PA-C  loratadine (CLARITIN) 10 MG tablet Take 1 tablet (10 mg total) by mouth daily for 5 days. 05/31/22 06/05/22 Yes Carroll Sage, PA-C  predniSONE (DELTASONE) 10 MG tablet Take 2 tablets (20 mg total) by mouth daily for 5 days. 05/31/22 06/05/22 Yes Carroll Sage, PA-C  diazepam (VALIUM) 5 MG tablet Take 1 tablet (5 mg total) by mouth every 6 (six) hours as needed for anxiety (spasms). 04/10/15   Gilda Crease, MD  ibuprofen (ADVIL,MOTRIN) 800 MG tablet Take 1 tablet (800 mg total) by mouth 3 (three) times daily. 04/10/15   Gilda Crease, MD  oxyCODONE-acetaminophen (PERCOCET/ROXICET) 5-325 MG tablet Take 1 tablet by  mouth every 8 (eight) hours as needed for severe pain. 06/18/19   McDonald, Mia A, PA-C      Allergies    Patient has no known allergies.    Review of Systems   Review of Systems  Constitutional:  Negative for chills and fever.  Respiratory:  Negative for shortness of breath.   Cardiovascular:  Negative for chest pain.  Gastrointestinal:  Negative for abdominal pain.  Skin:  Positive for rash.  Neurological:  Negative for headaches.    Physical Exam Updated Vital Signs BP (!) 155/92   Pulse 74   Temp 98.8 F (37.1 C)   Resp 14   Ht 5\' 10"  (1.778 m)   Wt 122.5 kg   SpO2 98%   BMI 38.74 kg/m  Physical Exam Vitals and nursing note reviewed.  Constitutional:      General: He is not in acute distress.    Appearance: He is not ill-appearing.  HENT:     Head: Normocephalic and atraumatic.     Nose: No congestion.     Mouth/Throat:     Mouth: Mucous membranes are moist.     Pharynx: Oropharynx is clear. No oropharyngeal exudate or posterior oropharyngeal erythema.     Comments: No trismus no torticollis tongue and uvula midline controlling oral secretions, no submandibular swelling. Eyes:     Conjunctiva/sclera: Conjunctivae normal.  Cardiovascular:     Rate and Rhythm: Normal rate and regular rhythm.  Pulmonary:     Effort: Pulmonary effort  is normal.  Skin:    General: Skin is warm and dry.     Comments: Patient is a erythematous macule like rash on his upper chest, blanches with pressure, nonscaling, slightly warm to the touch, no hives present.  No rashes noted on patient's upper extremity lower extremities back or abdomen.  Neurological:     Mental Status: He is alert.  Psychiatric:        Mood and Affect: Mood normal.     ED Results / Procedures / Treatments   Labs (all labs ordered are listed, but only abnormal results are displayed) Labs Reviewed - No data to display  EKG None  Radiology No results found.  Procedures Procedures    Medications  Ordered in ED Medications  famotidine (PEPCID) tablet 20 mg (20 mg Oral Given 05/31/22 0153)  predniSONE (DELTASONE) tablet 20 mg (20 mg Oral Given 05/31/22 0154)    ED Course/ Medical Decision Making/ A&P                           Medical Decision Making Risk Prescription drug management.   This patient presents to the ED for concern of allergic reaction, this involves an extensive number of treatment options, and is a complaint that carries with it a high risk of complications and morbidity.  The differential diagnosis includes anaphylaxis, angioedema, cellulitis    Additional history obtained:  Additional history obtained from EMS External records from outside source obtained and reviewed including previous ED notes   Co morbidities that complicate the patient evaluation  N/A  Social Determinants of Health:  N/A    Lab Tests:  I Ordered, and personally interpreted labs.  The pertinent results include: N/A   Imaging Studies ordered:  I ordered imaging studies including N/A I independently visualized and interpreted imaging which showed N/A I agree with the radiologist interpretation   Cardiac Monitoring:  The patient was maintained on a cardiac monitor.  I personally viewed and interpreted the cardiac monitored which showed an underlying rhythm of: N/A   Medicines ordered and prescription drug management:  I ordered medication including H2 blockers, prednisone I have reviewed the patients home medicines and have made adjustments as needed  Critical Interventions:  N/A   Reevaluation:  Presents with allergic reaction, patient had a benign physical exam, due to the redness up on his chest, will provide him with H2 blocker as well as prednisone and monitor.  Reassessed the patient he is resting comfortably, rash has improved, states that he is feeling better, does not endorse any tongue throat lip swelling difficulty breathing, he is agreeable to plan  discharge at this time.  Consultations Obtained:  N/A    Test Considered:  N/A    Rule out Low suspicion for angioedema as there is no oral involvement.  Low suspicion for anaphylaxis vital signs are reassuring, no GI symptoms, no hives present on my exam.  Low suspicion for cellulitis presentation atypical of etiology.    Dispostion and problem list  After consideration of the diagnostic results and the patients response to treatment, I feel that the patent would benefit from discharge.  Allergic reaction- will have him continue taking H1 and H2 blockers also start a short course of steroids, follow-up with PCP for further evaluation and strict return precautions.            Final Clinical Impression(s) / ED Diagnoses Final diagnoses:  Allergic reaction, initial encounter  Rx / DC Orders ED Discharge Orders          Ordered    famotidine (PEPCID) 20 MG tablet  2 times daily        05/31/22 0245    loratadine (CLARITIN) 10 MG tablet  Daily        05/31/22 0245    predniSONE (DELTASONE) 10 MG tablet  Daily        05/31/22 0245              Carroll Sage, PA-C 05/31/22 0248    Dione Booze, MD 05/31/22 [phone]

## 2022-08-22 IMAGING — DX DG FOOT COMPLETE 3+V*L*
3 series · 3 of 3 positions shown · non-contrast
Comparison: None.

CLINICAL DATA: Calcaneal pain

EXAM:
LEFT FOOT - COMPLETE 3+ VIEW

[foot ap]
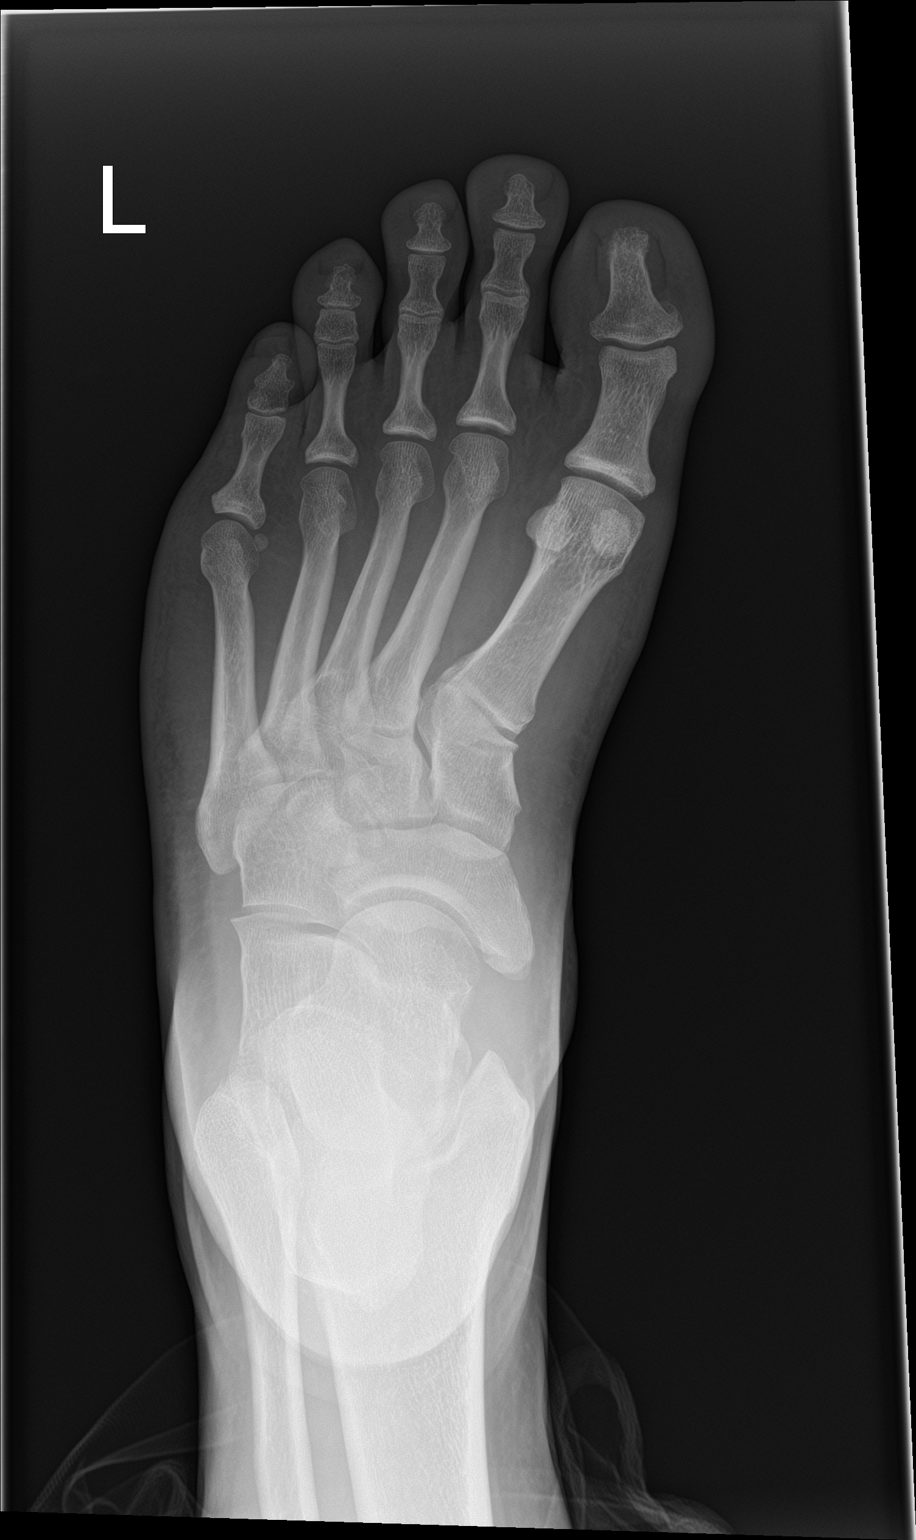

[foot obl]
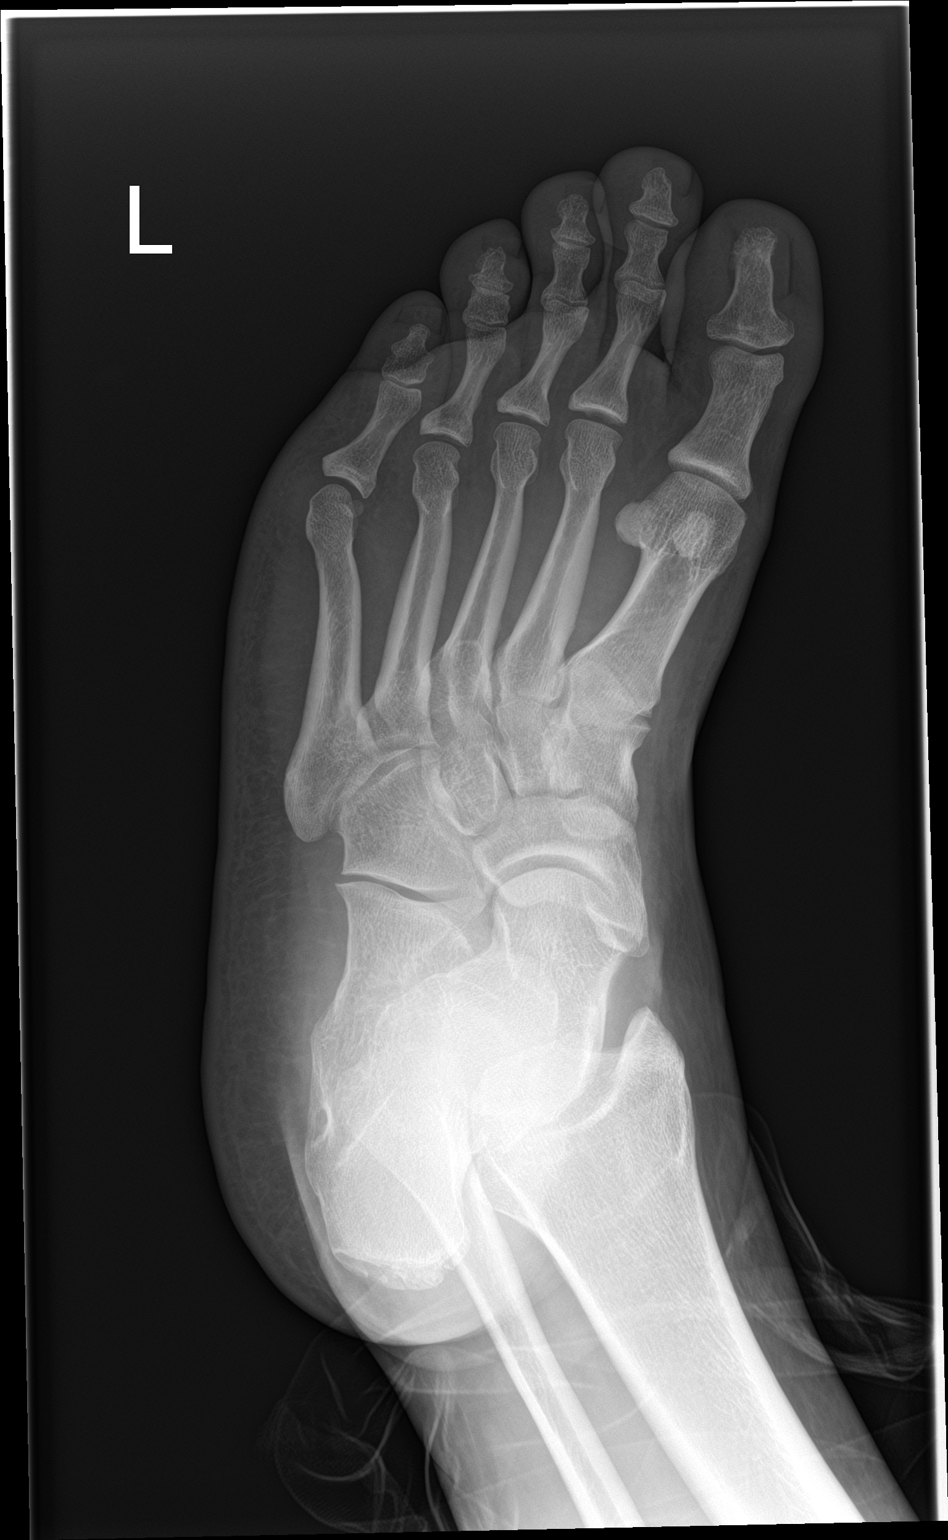

[foot lat]
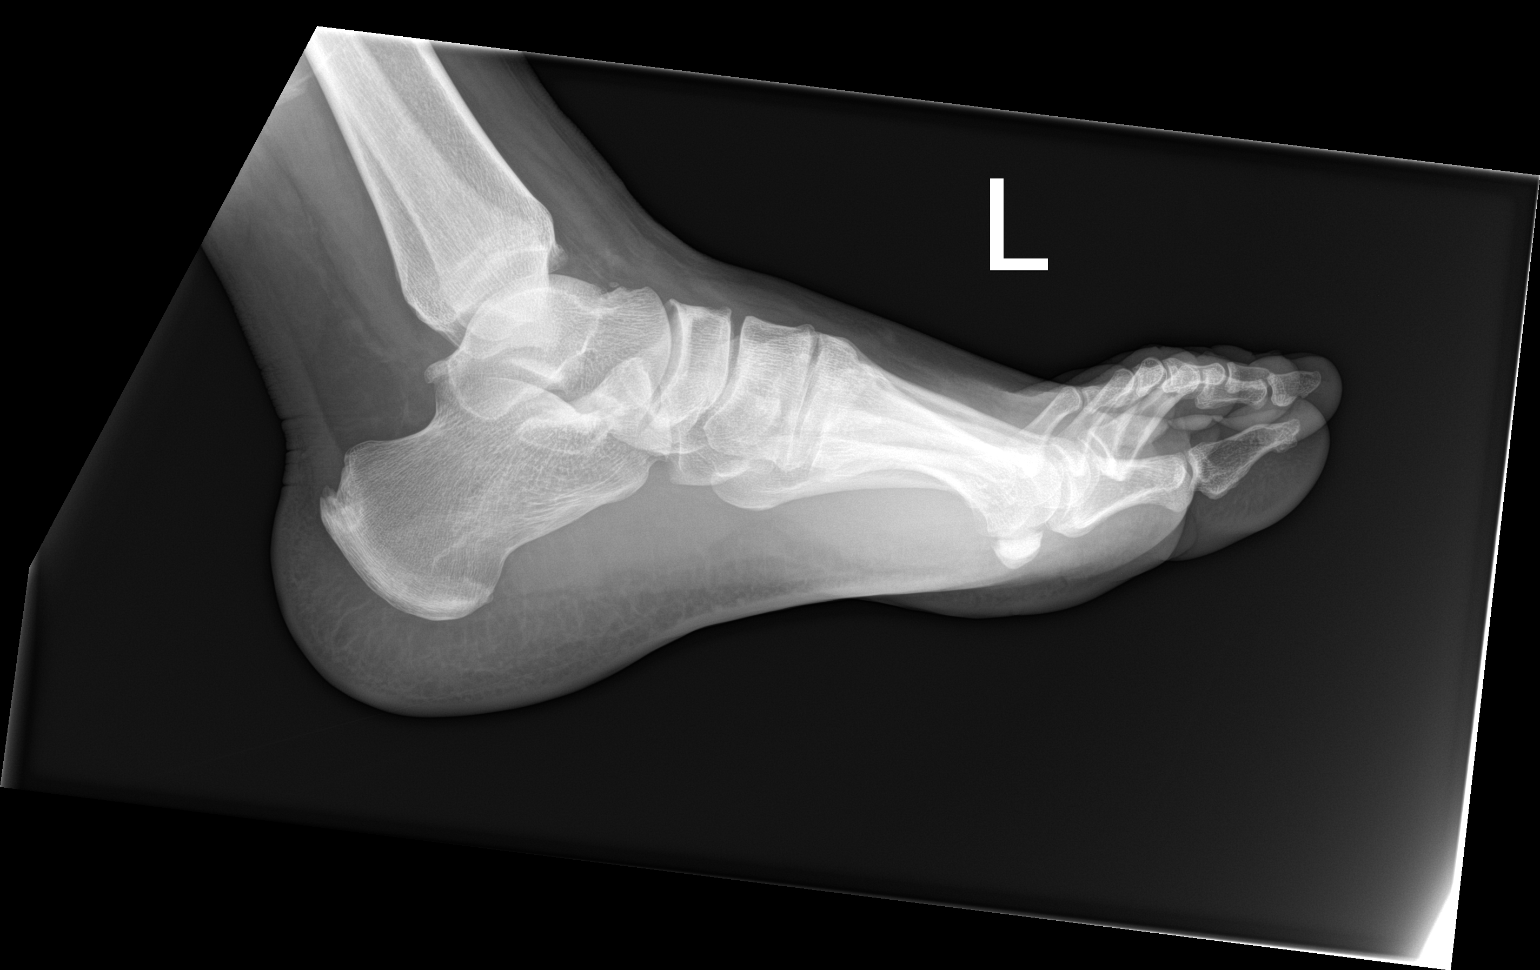

[3 of 3 positions shown; findings below may reference images not displayed]

FINDINGS: No acute displaced fracture or malalignment. Minimal linear
sclerosis within the lower calcaneus, location and orientation not
considered typical for stress fracture. Tiny plantar calcaneal spur.
IMPRESSION: 1. No definite acute osseous abnormality.
2. Very tiny plantar calcaneal spur
3. There is some linear sclerosis within the inferior calcaneus but
the location and orientation is not felt typical for stress fracture

## 2023-05-08 ENCOUNTER — Emergency Department (HOSPITAL_COMMUNITY)
Admission: EM | Admit: 2023-05-08 | Discharge: 2023-05-08 | Disposition: A | Discharge status: Home / Self Care | Payer: Self-pay | Attending: Student | Admitting: Student

## 2023-05-08 DIAGNOSIS — L299 Pruritus, unspecified: Secondary | ICD-10-CM | POA: Insufficient documentation

## 2023-05-08 DIAGNOSIS — L539 Erythematous condition, unspecified: Secondary | ICD-10-CM | POA: Insufficient documentation

## 2023-05-08 DIAGNOSIS — L559 Sunburn, unspecified: Secondary | ICD-10-CM | POA: Insufficient documentation

## 2023-05-08 MED ORDER — HYDROXYZINE HCL 25 MG PO TABS
25.0000 mg | ORAL_TABLET | Freq: Once | ORAL | Status: AC
  Administered 2023-05-08: 25 mg via ORAL
  Filled 2023-05-08: qty 1

## 2023-05-08 MED ORDER — HYDROXYZINE HCL 25 MG PO TABS: 25.0000 mg | ORAL_TABLET | Freq: Three times a day (TID) | ORAL | 0 refills | Status: AC | PRN

## 2023-05-08 NOTE — ED Triage Notes (Signed)
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 Denies issues with throat or swelling.

## 2023-05-08 NOTE — ED Notes (Signed)
Patient driving home. Patient made aware that Atarax may cause drowsiness. Patient still wanted to take the pill. He said he lives 6 mins away and will call his wife if he began to feel sleepy.

## 2023-05-08 NOTE — Discharge Instructions (Addendum)
Please go to the pharmacy and purchase:  - Dove body wash for use in the shower - Vaseline soothing hydration lotion for after shower once dry

## 2023-05-09 NOTE — ED Provider Notes (Signed)
EMERGENCY DEPARTMENT AT [facility] Provider Note  CSN: [id number] Arrival date & time: 05/08/23 1756  Chief Complaint(s) Allergic Reaction  HPI Jermaine Duke is a 37 y.o. male who presents emergency department for evaluation of pruritus and concern for an allergic reaction.  Patient states that he was at the beach this weekend and since returning has had progressive itching of his chest arms back and trunk.  He states that this has happened before when returning from the beach on a previous episode but ultimately went away after a few days.  He works in an apartment complex and usually gets watery eyes when around cats and is unsure if this is related.  He states his symptoms are also present after getting out of the shower.  Patient arrives with noticeable sunburn over pruritic areas in question.  Denies shortness of breath, wheezing, dysphagia, abdominal pain, nausea or other systemic or allergic symptoms.   Past Medical History No past medical history on file. There are no problems to display for this patient.  Home Medication(s) Prior to Admission medications   Medication Sig Start Date End Date Taking? Authorizing Provider  hydrOXYzine (ATARAX) 25 MG tablet Take 1 tablet (25 mg total) by mouth every 8 (eight) hours as needed for itching. 05/08/23  Yes Newman Waren, MD  diazepam (VALIUM) 5 MG tablet Take 1 tablet (5 mg total) by mouth every 6 (six) hours as needed for anxiety (spasms). 04/10/15   Gilda Crease, MD  famotidine (PEPCID) 20 MG tablet Take 1 tablet (20 mg total) by mouth 2 (two) times daily for 5 days. 05/31/22 06/05/22  Carroll Sage, PA-C  ibuprofen (ADVIL,MOTRIN) 800 MG tablet Take 1 tablet (800 mg total) by mouth 3 (three) times daily. 04/10/15   Gilda Crease, MD  loratadine (CLARITIN) 10 MG tablet Take 1 tablet (10 mg total) by mouth daily for 5 days. 05/31/22 06/05/22  Carroll Sage, PA-C   oxyCODONE-acetaminophen (PERCOCET/ROXICET) 5-325 MG tablet Take 1 tablet by mouth every 8 (eight) hours as needed for severe pain. 06/18/19   McDonald, Coral Else, PA-C                                                                                                                                    Past Surgical History No past surgical history on file. Family History No family history on file.  Social History Social History   Tobacco Use   Smoking status: Never   Smokeless tobacco: Never   Allergies Patient has no known allergies.  Review of Systems Review of Systems  Skin:  Positive for rash.    Physical Exam Vital Signs  I have reviewed the triage vital signs BP 118/75   Pulse 76   Temp 98.4 F (36.9 C) (Oral)   Resp 16   SpO2 95%   Physical Exam Constitutional:      General: He is not  in acute distress.    Appearance: Normal appearance.  HENT:     Head: Normocephalic and atraumatic.     Nose: No congestion or rhinorrhea.  Eyes:     General:        Right eye: No discharge.        Left eye: No discharge.     Extraocular Movements: Extraocular movements intact.     Pupils: Pupils are equal, round, and reactive to light.  Cardiovascular:     Rate and Rhythm: Normal rate and regular rhythm.     Heart sounds: No murmur heard. Pulmonary:     Effort: No respiratory distress.     Breath sounds: No wheezing or rales.  Abdominal:     General: There is no distension.     Tenderness: There is no abdominal tenderness.  Musculoskeletal:        General: Normal range of motion.     Cervical back: Normal range of motion.  Skin:    General: Skin is warm and dry.     Findings: Erythema present.  Neurological:     General: No focal deficit present.     Mental Status: He is alert.     ED Results and Treatments Labs (all labs ordered are listed, but only abnormal results are displayed) Labs Reviewed - No data to display                                                                                                                         Radiology No results found.  Pertinent labs & imaging results that were available during my care of the patient were reviewed by me and considered in my medical decision making (see MDM for details).  Medications Ordered in ED Medications  hydrOXYzine (ATARAX) tablet 25 mg (25 mg Oral Given 05/08/23 1920)                                                                                                                                     Procedures Procedures  (including critical care time)  Medical Decision Making / ED Course   This patient presents to the ED for concern of pruritus, this involves an extensive number of treatment options, and is a complaint that carries with it a high risk of complications and morbidity.  The differential diagnosis includes sunburn, dry skin, urticaria, allergic reaction, anaphylaxis  MDM: Patient seen  emergency room for evaluation of pruritus.  Physical exam reveals superficial sunburn over bilateral upper extremities, chest and back consistent with area in question.  Given that patient's symptoms acutely worsen and areas of sunburn after going to the beach, and also seem to worsen after getting out of the shower, I have higher suspicion that the patient is removing the top layer of protective oil on his skin and ultimately leading to generalized pruritus.  Previously, as his sunburn improves his symptoms go away which again appears more consistent with dry skin and thus consistent with true allergic reaction.  He was given Atarax for symptom control and instructed to change his body wash to Dove and moisturize appropriately once dry with aloe vera based Vaseline body lotion.  At this time he does not meet inpatient criteria for admission he is safe for discharge.  There is no wheezing or oral pharyngeal swelling on exam and I have very low suspicion for developing anaphylaxis.   Additional  history obtained:  -External records from outside source obtained and reviewed including: Chart review including previous notes, labs, imaging, consultation notes     Medicines ordered and prescription drug management: Meds ordered this encounter  Medications   hydrOXYzine (ATARAX) tablet 25 mg   hydrOXYzine (ATARAX) 25 MG tablet    Sig: Take 1 tablet (25 mg total) by mouth every 8 (eight) hours as needed for itching.    Dispense:  12 tablet    Refill:  0    -I have reviewed the patients home medicines and have made adjustments as needed  Critical interventions none    Cardiac Monitoring: The patient was maintained on a cardiac monitor.  I personally viewed and interpreted the cardiac monitored which showed an underlying rhythm of: NSR  Social Determinants of Health:  Factors impacting patients care include: Recent trip to the beach   Reevaluation: After the interventions noted above, I reevaluated the patient and found that they have :improved  Co morbidities that complicate the patient evaluation No past medical history on file.    Dispostion: I considered admission for this patient, but at this time he does not meet inpatient criteria for admission he is safe for discharge with outpatient follow-up     Final Clinical Impression(s) / ED Diagnoses Final diagnoses:  Pruritus  Sunburn     @PCDICTATION @    Glendora Score, MD 05/09/23 1245
